# Patient Record
Sex: Female | Born: 2000 | Race: Black or African American | Hispanic: No | Marital: Single | Smoking: Never smoker
Health system: Southern US, Community
[De-identification: ages and names within clinical notes are randomized; demographics above are authoritative.]

## PROBLEM LIST (undated history)

## (undated) DIAGNOSIS — I10 Essential (primary) hypertension: Secondary | ICD-10-CM

## (undated) HISTORY — PX: WISDOM TOOTH EXTRACTION: SHX21

## (undated) HISTORY — PX: ENDOMETRIAL ABLATION: SHX621

---

## 2019-09-26 ENCOUNTER — Emergency Department (HOSPITAL_COMMUNITY)
Admission: EM | Admit: 2019-09-26 | Discharge: 2019-09-26 | Disposition: A | Discharge status: Home / Self Care | Payer: Self-pay

## 2019-09-26 MED ORDER — SODIUM CHLORIDE 0.9% FLUSH: 3.0000 mL | Freq: Once | INTRAVENOUS | Status: DC

## 2019-09-26 NOTE — ED Triage Notes (Signed)
Arrived POV patient in waiting room screaming and writhing in pain. Patient reports lower abdominal cramping and vomiting that started around 1400. Patient's skin hot and clammy

## 2019-09-26 NOTE — ED Notes (Signed)
Pt ambulating out of the room stating that she was going to a different hospital. This RN told pt that she was going to draw her blood. Pt refused and stated that we have done nothing for her and was leaving. Pt is ambulatory on discharge.

## 2019-10-22 ENCOUNTER — Emergency Department (HOSPITAL_COMMUNITY)
Admission: EM | Admit: 2019-10-22 | Discharge: 2019-10-22 | Disposition: A | Discharge status: Home / Self Care | Payer: Self-pay | Attending: Emergency Medicine | Admitting: Emergency Medicine

## 2019-10-22 ENCOUNTER — Other Ambulatory Visit: Payer: Self-pay

## 2019-10-22 ENCOUNTER — Encounter (HOSPITAL_COMMUNITY): Payer: Self-pay

## 2019-10-22 ENCOUNTER — Emergency Department (HOSPITAL_COMMUNITY): Payer: Self-pay

## 2019-10-22 DIAGNOSIS — E860 Dehydration: Secondary | ICD-10-CM

## 2019-10-22 DIAGNOSIS — Z3491 Encounter for supervision of normal pregnancy, unspecified, first trimester: Secondary | ICD-10-CM

## 2019-10-22 DIAGNOSIS — R300 Dysuria: Secondary | ICD-10-CM | POA: Insufficient documentation

## 2019-10-22 DIAGNOSIS — O26891 Other specified pregnancy related conditions, first trimester: Secondary | ICD-10-CM | POA: Insufficient documentation

## 2019-10-22 DIAGNOSIS — K625 Hemorrhage of anus and rectum: Secondary | ICD-10-CM | POA: Insufficient documentation

## 2019-10-22 DIAGNOSIS — R102 Pelvic and perineal pain: Secondary | ICD-10-CM

## 2019-10-22 DIAGNOSIS — R55 Syncope and collapse: Secondary | ICD-10-CM

## 2019-10-22 DIAGNOSIS — R1084 Generalized abdominal pain: Secondary | ICD-10-CM | POA: Insufficient documentation

## 2019-10-22 DIAGNOSIS — Z3A01 Less than 8 weeks gestation of pregnancy: Secondary | ICD-10-CM | POA: Insufficient documentation

## 2019-10-22 DIAGNOSIS — N898 Other specified noninflammatory disorders of vagina: Secondary | ICD-10-CM | POA: Insufficient documentation

## 2019-10-22 LAB — URINALYSIS, ROUTINE W REFLEX MICROSCOPIC
Bacteria, UA: NONE SEEN
Bilirubin Urine: NEGATIVE
Glucose, UA: NEGATIVE mg/dL
Hgb urine dipstick: NEGATIVE
Ketones, ur: 80 mg/dL — AB
Leukocytes,Ua: NEGATIVE
Nitrite: NEGATIVE
Protein, ur: 30 mg/dL — AB
Specific Gravity, Urine: 1.031 — ABNORMAL HIGH (ref 1.005–1.030)
pH: 5 (ref 5.0–8.0)

## 2019-10-22 LAB — CBC
HCT: 41.5 % (ref 36.0–46.0)
Hemoglobin: 14.1 g/dL (ref 12.0–15.0)
MCH: 32.3 pg (ref 26.0–34.0)
MCHC: 34 g/dL (ref 30.0–36.0)
MCV: 95 fL (ref 80.0–100.0)
Platelets: 279 10*3/uL (ref 150–400)
RBC: 4.37 MIL/uL (ref 3.87–5.11)
RDW: 11.8 % (ref 11.5–15.5)
WBC: 9.4 10*3/uL (ref 4.0–10.5)
nRBC: 0 % (ref 0.0–0.2)

## 2019-10-22 LAB — BASIC METABOLIC PANEL
Anion gap: 10 (ref 5–15)
BUN: 13 mg/dL (ref 6–20)
CO2: 22 mmol/L (ref 22–32)
Calcium: 9 mg/dL (ref 8.9–10.3)
Chloride: 105 mmol/L (ref 98–111)
Creatinine, Ser: 0.77 mg/dL (ref 0.44–1.00)
GFR calc Af Amer: >60 mL/min (ref >60)
GFR calc non Af Amer: >60 mL/min (ref >60)
Glucose, Bld: 89 mg/dL (ref 70–99)
Potassium: 3.4 mmol/L — ABNORMAL LOW (ref 3.5–5.1)
Sodium: 137 mmol/L (ref 135–145)

## 2019-10-22 LAB — WET PREP, GENITAL
Clue Cells Wet Prep HPF POC: NONE SEEN
Sperm: NONE SEEN
Trich, Wet Prep: NONE SEEN
Yeast Wet Prep HPF POC: NONE SEEN

## 2019-10-22 LAB — HCG, QUANTITATIVE, PREGNANCY: hCG, Beta Chain, Quant, S: 106 m[IU]/mL — ABNORMAL HIGH (ref <5)

## 2019-10-22 LAB — HEPATIC FUNCTION PANEL
ALT: 14 U/L (ref 0–44)
AST: 20 U/L (ref 15–41)
Albumin: 4.1 g/dL (ref 3.5–5.0)
Alkaline Phosphatase: 52 U/L (ref 38–126)
Bilirubin, Direct: 0.3 mg/dL — ABNORMAL HIGH (ref 0.0–0.2)
Indirect Bilirubin: 2.7 mg/dL — ABNORMAL HIGH (ref 0.3–0.9)
Total Bilirubin: 3 mg/dL — ABNORMAL HIGH (ref 0.3–1.2)
Total Protein: 7.4 g/dL (ref 6.5–8.1)

## 2019-10-22 LAB — I-STAT BETA HCG BLOOD, ED (MC, WL, AP ONLY): I-stat hCG, quantitative: 102.9 m[IU]/mL — ABNORMAL HIGH (ref <5)

## 2019-10-22 LAB — CK: Total CK: 151 U/L (ref 38–234)

## 2019-10-22 MED ORDER — LACTATED RINGERS IV BOLUS
1000.0000 mL | Freq: Once | INTRAVENOUS | Status: AC
  Administered 2019-10-22: 1000 mL via INTRAVENOUS

## 2019-10-22 NOTE — ED Notes (Signed)
Hooked patient up to the monitor patient is resting with call bell in reach 

## 2019-10-22 NOTE — Discharge Instructions (Signed)
So today your labs looked normal except for you were dehydrated.  Also it appears that you are early pregnant but the ultrasound was normal.  It is very important that you follow-up with OB/GYN to have those blood test rechecked to make sure it is increasing appropriately.  Also you can start taking an over the counter prenatal vitamin.  Make sure you are staying well-hydrated.

## 2019-10-22 NOTE — ED Notes (Signed)
Patient is geeting into a gown

## 2019-10-22 NOTE — ED Provider Notes (Signed)
[facility] EMERGENCY DEPARTMENT Provider Note   CSN: [id number] Arrival date & time: 10/22/19  1001     History Chief Complaint  Patient presents with  . Loss of Consciousness    Lori Pruitt is a 19 y.o. female.  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  Patient reported for the last 2 weeks she has had diffuse body pain even with minimal activity and is just not felt well.  She states she has a poor appetite and she is only been eating every other day but is trying to drink plenty of fluids and do protein shakes intermittently.  She normally has constipation and last bowel movement was several days ago.  She does have intermittent bright red blood with her stool and is also present on the toilet paper.  She has had some mild vaginal discharge and reports that she always has some burning with urination.  She denies any cough or shortness of breath.  Yesterday she was standing in the kitchen cooking when she started to feel lightheaded and woozy and went to lay down but passed out prior to getting to the bed.  She reports today she has just not felt well.  She denies any localized constant abdominal pain but states she gets intermittent cramps like when she is on her cycle.  She denies any localized back pain, fever or vomiting.   The history is provided by the patient.  Loss of Consciousness Episode history:  Single Most recent episode:  Yesterday Timing:  Constant Progression:  Resolved Chronicity:  New Context: standing up   Witnessed: no   Relieved by:  Lying down Worsened by:  Nothing Ineffective treatments:  None tried      History reviewed. No pertinent past medical history.  There are no problems to display for this  patient.   History reviewed. No pertinent surgical history.   OB History   No obstetric history on file.     No family history on file.  Social History   Tobacco Use  . Smoking status: Not on file  Substance Use Topics  . Alcohol use: Not on file  . Drug use: Not on file    Home Medications Prior to Admission medications   Medication Sig Start Date End Date Taking? Authorizing Provider  HYDROcodone-acetaminophen (NORCO/VICODIN) 5-325 MG tablet Take 1 tablet by mouth every 6 (six) hours as needed for moderate pain.   Yes [provider]  busPIRone (BUSPAR) 5 MG tablet Take 5 mg by mouth in the morning, at noon, and at bedtime.    [provider]  PRESCRIPTION MEDICATION Take 1 tablet by mouth daily. Diuretic Medication    [provider]    Allergies    Amoxicillin and Penicillins  Review of Systems   Review of Systems  Cardiovascular: Positive for syncope.  All other systems reviewed and are negative.   Physical Exam Updated Vital Signs BP 125/84 (BP Location: Right Arm)   Pulse (!) 115   Temp 99.2 F (37.3 C) (Oral)   Resp (!) 24   Ht 5\' 3"  (1.6 m)   Wt 81.6 kg   LMP 09/30/2019   SpO2 100%   BMI 31.89 kg/m   Physical Exam Vitals and nursing note reviewed.  Constitutional:  General: She is not in acute distress.    Appearance: Normal appearance. She is well-developed.  HENT:     Head: Normocephalic and atraumatic.     Mouth/Throat:     Mouth: Mucous membranes are dry.  Eyes:     Conjunctiva/sclera: Conjunctivae normal.     Pupils: Pupils are equal, round, and reactive to light.  Cardiovascular:     Rate and Rhythm: Regular rhythm. Tachycardia present.     Pulses: Normal pulses.     Heart sounds: No murmur.  Pulmonary:     Effort: Pulmonary effort is normal. No respiratory distress.     Breath sounds: Normal breath sounds. No wheezing or rales.  Abdominal:     General: There is no distension.     Palpations:  Abdomen is soft.     Tenderness: There is no abdominal tenderness. There is no right CVA tenderness, left CVA tenderness, guarding or rebound.  Genitourinary:    Vagina: Vaginal discharge present.     Cervix: Discharge present. No cervical bleeding.     Uterus: Normal.      Adnexa:        Right: Tenderness present.        Left: Tenderness present.      Comments: Mild diffuse adnexal tenderness.  Thick curd-like discharge present in the vaginal vault Musculoskeletal:        General: No tenderness. Normal range of motion.     Cervical back: Normal range of motion and neck supple.  Skin:    General: Skin is warm and dry.     Findings: No erythema or rash.  Neurological:     General: No focal deficit present.     Mental Status: She is alert and oriented to person, place, and time. Mental status is at baseline.  Psychiatric:        Mood and Affect: Mood normal.        Behavior: Behavior normal.        Thought Content: Thought content normal.     ED Results / Procedures / Treatments   Labs (all labs ordered are listed, but only abnormal results are displayed) Labs Reviewed  WET PREP, GENITAL - Abnormal; Notable for the following components:      Result Value   WBC, Wet Prep HPF POC MODERATE (*)    All other components within normal limits  BASIC METABOLIC PANEL - Abnormal; Notable for the following components:   Potassium 3.4 (*)    All other components within normal limits  URINALYSIS, ROUTINE W REFLEX MICROSCOPIC - Abnormal; Notable for the following components:   APPearance HAZY (*)    Specific Gravity, Urine 1.031 (*)    Ketones, ur 80 (*)    Protein, ur 30 (*)    All other components within normal limits  HEPATIC FUNCTION PANEL - Abnormal; Notable for the following components:   Total Bilirubin 3.0 (*)    Bilirubin, Direct 0.3 (*)    Indirect Bilirubin 2.7 (*)    All other components within normal limits  HCG, QUANTITATIVE, PREGNANCY - Abnormal; Notable for the  following components:   hCG, Beta Chain, Quant, S 106 (*)    All other components within normal limits  I-STAT BETA HCG BLOOD, ED ([facility], [facility], [facility] ONLY) - Abnormal; Notable for the following components:   I-stat hCG, quantitative 102.9 (*)    All other components within normal limits  CBC  CK  GC/CHLAMYDIA PROBE AMP ([place]) NOT AT [facility]  EKG EKG Interpretation  Date/Time:  Wednesday October 22 2019 10:07:36 EDT Ventricular Rate:  129 PR Interval:  122 QRS Duration: 80 QT Interval:  290 QTC Calculation: 424 R Axis:   77 Text Interpretation: Sinus tachycardia Nonspecific T wave abnormality No previous tracing Confirmed by Gwyneth Sprout ([id number]) on 10/22/2019 10:30:17 AM   Radiology US OB LESS THAN 14 WEEKS WITH OB TRANSVAGINAL  Result Date: 10/22/2019 CLINICAL DATA:  Pelvic pain, first trimester pregnancy, quantitative beta HCG = 103; unknown LMP EXAM: OBSTETRIC <14 WK [place] AND TRANSVAGINAL OB US TECHNIQUE: Both transabdominal and transvaginal ultrasound examinations were performed for complete evaluation of the gestation as well as the maternal uterus, adnexal regions, and pelvic cul-de-sac. Transvaginal technique was performed to assess early pregnancy. COMPARISON:  None FINDINGS: Intrauterine gestational sac: Absent Yolk sac:  N/A Embryo:  N/A Cardiac Activity: N/A Heart Rate: N/A  bpm MSD:   mm    w     d CRL:    mm    w    d                  [place] EDC: Subchorionic hemorrhage:  N/A Maternal uterus/adnexae: Uterus retroverted, normal in morphology. Endometrial complex 12 mm thick, unremarkable. No endometrial fluid or gestational sac. RIGHT ovary normal size and morphology 2.7 x 3.2 x 2.7 cm, containing a corpus luteum. LEFT ovary normal size and morphology, 2.2 x 3.4 x 2.1 cm. Trace free pelvic fluid. No adnexal masses. IMPRESSION: No intrauterine gestation identified. Findings are consistent with pregnancy of unknown location. Differential diagnosis includes early intrauterine pregnancy  too early to visualize, spontaneous abortion, and ectopic pregnancy. Serial quantitative beta HCG and or follow-up ultrasound recommended to definitively exclude ectopic pregnancy. Electronically Signed   By: Ulyses Southward M.D.   On: 10/22/2019 13:47    Procedures Procedures (including critical care time)  Medications Ordered in ED Medications  lactated ringers bolus 1,000 mL (has no administration in time range)    ED Course  I have reviewed the triage vital signs and the nursing notes.  Pertinent labs & imaging results that were available during my care of the patient were reviewed by me and considered in my medical decision making (see chart for details).    MDM Rules/Calculators/A&P                      Patient presenting with symptoms of syncope and diffuse body pain for the last 2 weeks.  She has had decreased oral intake.  Also having some vaginal discharge and dysuria.  Patient has low-grade temperature today of 99.2 and a heart rate of 124.  EKG shows sinus tachycardia with a nonspecific T wave change which may be her baseline.  Patient's medical care has been done in [place] prior to this and she was most recently evaluated less than a month ago due to several medical conditions and had follow-up for cardiologist but has not seen them or had an echo done.  Patient had a negative D-dimer, labs and troponins done 3 weeks ago.  That time her UDS was positive for fentanyl.  She has a history of PID and recurrent pelvic abscess as well as endometriosis.  Patient syncope may have been related to hypotension or dehydration as she has had decreased oral intake.  She is also complaining of diffuse body pain.  Patient's hCG today is elevated at 100 and concerned that early pregnancy or possibly ectopic.  Be the cause  of her symptoms.  Patient has recently had prolactin levels, ACTH, cortisol, TSH and various other endocrine labs done as she has a known pituitary microadenoma and all those  tests are within normal limits within the last month.  Patient's BMP without acute findings, CBC with normal white count and hemoglobin.  We will do LFTs, CK and also will perform a pelvic exam with wet prep, GC and chlamydia.  Patient given IV fluids.  Most likely will need an ultrasound to rule out TOA or ectopic.  12:00 PM Patient's urine shows ketones but no evidence of infection, liver function and CK are still pending.  Pelvic exam with thick discharge that could be yeast but also diffuse pelvic tenderness.  Possible that this is PID however patient reports she always has this type of pain due to her endometriosis.  We will do an ultrasound to rule out acute infection or ectopic pregnancy as patient's hCG is 100.  Most likely it is early pregnancy and she will need followed by OB/GYN.  Patient did arrive here approximately 3 weeks ago from [place].  She did fly and reports her longest flight was 6 hours.  She has no unilateral leg pain or swelling.  2:45 PM Patient's wet prep with moderate white cells but otherwise normal, quantitative hCG is confirmatory with a hCG of 106.  Ultrasound without acute findings or evidence of ovarian pathology at this time.  Suspect this is just early pregnancy however instructed patient to follow-up with OB/GYN for serial hCGs.  Patient's urine without infection but does show 80 ketones.  Suspect dehydration is most likely why she syncopized.  Her CK was normal and LFTs show chronically elevated bilirubin but no other acute findings.  Patient's tachycardia improved after fluids to the low 100s and upper 90s which based on prior records seems to be her baseline.  Patient was discharged home in good condition. MDM Number of Diagnoses or Management Options First trimester pregnancy: new, needed workup Syncope and collapse: new, needed workup   Amount and/or Complexity of Data Reviewed Clinical lab tests: ordered and reviewed Tests in the radiology section of CPT:  ordered and reviewed Tests in the medicine section of CPT: ordered and reviewed Discussion of test results with the performing providers: yes Decide to obtain previous medical records or to obtain history from someone other than the patient: yes Obtain history from someone other than the patient: no Review and summarize past medical records: yes Discuss the patient with other providers: no Independent visualization of images, tracings, or specimens: yes  Risk of Complications, Morbidity, and/or Mortality Presenting problems: moderate Diagnostic procedures: minimal Management options: minimal  Patient Progress Patient progress: improved   Final Clinical Impression(s) / ED Diagnoses Final diagnoses:  First trimester pregnancy  Syncope and collapse  Dehydration    Rx / DC Orders ED Discharge Orders    None       Blanchie Dessert, MD 10/22/19 1447

## 2019-10-22 NOTE — ED Triage Notes (Signed)
Pt reports syncopal episode last night while cooking, pt c.o muscle aches today. Pt a.o, nad noted.

## 2019-10-23 LAB — GC/CHLAMYDIA PROBE AMP (~~LOC~~) NOT AT ARMC
Chlamydia: NEGATIVE
Comment: NEGATIVE
Comment: NORMAL
Neisseria Gonorrhea: NEGATIVE

## 2019-10-25 ENCOUNTER — Encounter (HOSPITAL_COMMUNITY): Payer: Self-pay

## 2019-10-25 ENCOUNTER — Inpatient Hospital Stay (HOSPITAL_COMMUNITY)
Admission: AD | Admit: 2019-10-25 | Discharge: 2019-10-25 | Disposition: A | Discharge status: Home / Self Care | Payer: Self-pay | Attending: Obstetrics & Gynecology | Admitting: Obstetrics & Gynecology

## 2019-10-25 ENCOUNTER — Other Ambulatory Visit: Payer: Self-pay

## 2019-10-25 DIAGNOSIS — O3680X0 Pregnancy with inconclusive fetal viability, not applicable or unspecified: Secondary | ICD-10-CM

## 2019-10-25 DIAGNOSIS — R109 Unspecified abdominal pain: Secondary | ICD-10-CM | POA: Insufficient documentation

## 2019-10-25 DIAGNOSIS — O26891 Other specified pregnancy related conditions, first trimester: Secondary | ICD-10-CM | POA: Insufficient documentation

## 2019-10-25 DIAGNOSIS — Z3A01 Less than 8 weeks gestation of pregnancy: Secondary | ICD-10-CM | POA: Insufficient documentation

## 2019-10-25 LAB — ABO/RH: ABO/RH(D): B POS

## 2019-10-25 LAB — CBC
HCT: 39.3 % (ref 36.0–46.0)
Hemoglobin: 13.6 g/dL (ref 12.0–15.0)
MCH: 32.7 pg (ref 26.0–34.0)
MCHC: 34.6 g/dL (ref 30.0–36.0)
MCV: 94.5 fL (ref 80.0–100.0)
Platelets: 292 10*3/uL (ref 150–400)
RBC: 4.16 MIL/uL (ref 3.87–5.11)
RDW: 11.9 % (ref 11.5–15.5)
WBC: 10.7 10*3/uL — ABNORMAL HIGH (ref 4.0–10.5)
nRBC: 0 % (ref 0.0–0.2)

## 2019-10-25 LAB — HCG, QUANTITATIVE, PREGNANCY: hCG, Beta Chain, Quant, S: 435 m[IU]/mL — ABNORMAL HIGH (ref <5)

## 2019-10-25 NOTE — ED Triage Notes (Signed)
Pt is complaining of what feels like period cramps for the last 4 days. Pt states she is pregnant. Pt found out she was pregnant on the 14th of this month. Pt denies any bleeding.

## 2019-10-25 NOTE — MAU Provider Note (Signed)
History     CSN: [id number]  Arrival date and time: 10/25/19 2043   First Provider Initiated Contact with Patient 10/25/19 2213      Chief Complaint  Patient presents with  . Abdominal Pain   Lori Pruitt is a 19 y.o. G4P0030 at [redacted]w[redacted]d by Definite LMP who has not established PNV.  She presents today for Abdominal Pain.  She states the pain started about 4 days ago and she describes as cramping.  She reports the pain is intermittent and "comes every couple of hours or when I am standing up."  She reports the pain is predominately on her right side with some transference to the left occasionally.  She reports she has not taken anything for the pain and denies any aggravating or relieving factors.  She reports some dizziness that makes it "feel like the room is spinning."  She reports that she last ate "the day before yesterday" and goes on to report that she doesn't have an appetite.  She reports drinking protein shakes twice daily.       OB History    Gravida  4   Para      Term      Preterm      AB  3   Living        SAB  2   TAB  1   Ectopic      Multiple      Live Births              History reviewed. No pertinent past medical history.  Past Surgical History:  Procedure Laterality Date  . ENDOMETRIAL ABLATION    . WISDOM TOOTH EXTRACTION      Family History  Problem Relation Age of Onset  . Diabetes Mother   . Hypertension Mother   . Hypertension Father   . Cancer Maternal Grandmother     Social History   Tobacco Use  . Smoking status: Never Smoker  Substance Use Topics  . Alcohol use: Never  . Drug use: Never    Allergies:  Allergies  Allergen Reactions  . Amoxicillin Anaphylaxis  . Penicillins Rash    Medications Prior to Admission  Medication Sig Dispense Refill Last Dose  . busPIRone (BUSPAR) 5 MG tablet Take 5 mg by mouth in the morning, at noon, and at bedtime.     Marland Kitchen HYDROcodone-acetaminophen (NORCO/VICODIN) 5-325 MG tablet  Take 1 tablet by mouth every 6 (six) hours as needed for moderate pain.     Marland Kitchen PRESCRIPTION MEDICATION Take 1 tablet by mouth daily. Diuretic Medication       Review of Systems  Constitutional: Negative for chills and fever.  Respiratory: Negative for cough and shortness of breath.   Gastrointestinal: Positive for abdominal pain and nausea. Negative for constipation, diarrhea and vomiting.  Genitourinary: Positive for dysuria ("All the time for years, even when I drink water." ). Negative for difficulty urinating, vaginal bleeding and vaginal discharge.  Neurological: Positive for dizziness. Negative for light-headedness and headaches.   Physical Exam   Blood pressure 122/78, pulse (!) 110, temperature 98.4 F (36.9 C), temperature source Oral, resp. rate 18, height 5\' 4"  (1.626 m), weight 81.6 kg, last menstrual period 09/30/2019, SpO2 100 %.  Physical Exam  Constitutional: She is oriented to person, place, and time. She appears well-developed and well-nourished. No distress.  HENT:  Head: Normocephalic and atraumatic.  Eyes: Conjunctivae are normal.  Cardiovascular: Normal rate, regular rhythm and normal heart sounds.  Respiratory:  Effort normal and breath sounds normal. No respiratory distress.  GI: Soft. She exhibits no distension. There is no abdominal tenderness. There is no rebound and no guarding.  Pt denies pain or tenderness, but reports "pressure' with palpation particular to RLQ and SP areas.   Musculoskeletal:        General: No tenderness or edema. Normal range of motion.     Cervical back: Normal range of motion.  Neurological: She is alert and oriented to person, place, and time.  Skin: Skin is warm and dry.  Psychiatric: She has a normal mood and affect. Her behavior is normal.    MAU Course  Procedures Results for orders placed or performed during the hospital encounter of 10/25/19 (from the past 24 hour(s))  CBC     Status: Abnormal   Collection Time: 10/25/19   9:55 PM  Result Value Ref Range   WBC 10.7 (H) 4.0 - 10.5 K/uL   RBC 4.16 3.87 - 5.11 MIL/uL   Hemoglobin 13.6 12.0 - 15.0 g/dL   HCT 27.0 35.0 - 09.3 %   MCV 94.5 80.0 - 100.0 fL   MCH 32.7 26.0 - 34.0 pg   MCHC 34.6 30.0 - 36.0 g/dL   RDW 81.8 29.9 - 37.1 %   Platelets 292 150 - 400 K/uL   nRBC 0.0 0.0 - 0.2 %  hCG, quantitative, pregnancy     Status: Abnormal   Collection Time: 10/25/19  9:55 PM  Result Value Ref Range   hCG, Beta Chain, Quant, S 435 (H) <5 mIU/mL  ABO/Rh     Status: None   Collection Time: 10/25/19  9:55 PM  Result Value Ref Range   ABO/RH(D) B POS    No rh immune globuloin      NOT A RH IMMUNE GLOBULIN CANDIDATE, PT RH POSITIVE Performed at [facility], [address], [place], [place] [address]     MDM Physical Exam Labs: ABO, CBC, hCG  Assessment and Plan  19 year old  G4P0030 at 3.4 weeks Abdominal Pain  -POC reviewed. -Informed that pelvic exam with testing performed with last c/o abdominal pain on 4/14 with all negative results. Will be deferred today. -Discussed need for repeat quant to r/o ectopic pregnancy considering pain located predominately on right side. -Offered pain medication and patient declines.  -Will await for results  Cherre Robins 10/25/2019, 10:13 PM   Reassessment (11:14 PM)  -Lori Pruitt returns with appropriate rise considering last quant was 3 days ago. -In room to discuss with patient who is going through cabinets and removing supplies! -Results discussed with patient as she places saline drops in her nares.  -Provider expresses concern with abdominal pain and patient agreeable to additional quant in 48 hours to confirm continued rise. -Scheduled for [place] office on Tuesday April 20th at 0900. -Instructed to monitor abdominal discomfort and return if symptoms worsen.  -Further instructed to take tylenol for management of pain. -Patient without further questions or concerns. -Information given on follow up and  testing. -Encouraged to call or return to MAU if symptoms worsen or with the onset of new symptoms. -Discharged to home in stable condition.  Cherre Robins MSN, CNM Advanced Practice Provider, [facility] [employer]

## 2019-10-25 NOTE — MAU Note (Signed)
Pt reports to MAU c/o cramping x 4 days. No bleeding. Pt states she was at work and she kept cramping so she came to the hospital. 4/10 on the pain scale. Pt has not taken any medications.

## 2019-10-25 NOTE — Discharge Instructions (Signed)
Human Chorionic Gonadotropin Test Why am I having this test? A human chorionic gonadotropin (hCG) test is done to determine whether you are pregnant. It can also be used:  To diagnose an abnormal pregnancy.  To determine whether you have had a failed pregnancy (miscarriage) or are at risk of one. What is being tested? This test checks the level of the human chorionic gonadotropin (hCG) hormone in the blood. This hormone is produced during pregnancy by the cells that form the placenta. The placenta is the organ that grows inside your womb (uterus) to nourish a developing baby. When you are pregnant, hCG can be detected in your blood or urine 7 to 8 days before your missed period. It continues to go up for the first 8-10 weeks of pregnancy. The presence of hCG in your blood can be measured with several different types of tests. You may have:  A urine test. ? Because this hormone is eliminated from your body by your kidneys, you may have a urine test to find out whether you are pregnant. A home pregnancy test detects whether there is hCG in your urine. ? A urine test only shows whether there is hCG in your urine. It does not measure how much.  A qualitative blood test. ? You may have this type of blood test to find out if you are pregnant. ? This blood test only shows whether there is hCG in your blood. It does not measure how much.  A quantitative blood test. ? This type of blood test measures the amount of hCG in your blood. ? You may have this test to:  Diagnose an abnormal pregnancy.  Check whether you have had a miscarriage.  Determine whether you are at risk of a miscarriage. What kind of sample is taken?     Two kinds of samples may be collected to test for the hCG hormone.  Blood. It is usually collected by inserting a needle into a blood vessel.  Urine. It is usually collected by urinating into a germ-free (sterile) specimen cup. It is best to collect the sample the first  time you urinate in the morning. How do I prepare for this test? No preparation is needed for a blood test.  For the urine test:  Let your health care provider know about: ? All medicines you are taking, including vitamins, herbs, creams, and over-the-counter medicines. ? Any blood in your urine. This may interfere with the result.  Do not drink too much fluid. Drink as you normally would, or as directed by your health care provider. How are the results reported? Depending on the type of test that you have, your test results may be reported as values. Your health care provider will compare your results to normal ranges that were established after testing a large group of people (reference ranges). Reference ranges may vary among labs and hospitals. For this test, common reference ranges that show absence of pregnancy are:  Quantitative hCG blood levels: less than 5 IU/L. Other results will be reported as either positive or negative. For this test, normal results (meaning the absence of pregnancy) are:  Negative for hCG in the urine test.  Negative for hCG in the qualitative blood test. What do the results mean? Urine and qualitative blood test  A negative result could mean: ? That you are not pregnant. ? That the test was done too early in your pregnancy to detect hCG in your blood or urine. If you still have other signs   of pregnancy, the test will be repeated.  A positive result means: ? That you are most likely pregnant. Your health care provider may confirm your pregnancy with an imaging study (ultrasound) of your uterus, if needed. Quantitative blood test Results of the quantitative hCG blood test will be interpreted as follows:  Less than 5 IU/L: You are most likely not pregnant.  Greater than 25 IU/L: You are most likely pregnant.  hCG levels that are higher than expected: ? You are pregnant with twins. ? You have abnormal growths in the uterus.  hCG levels that are  rising more slowly than expected: ? You have an ectopic pregnancy (also called a tubal pregnancy).  hCG levels that are falling: ? You may be having a miscarriage. Talk with your health care provider about what your results mean. Questions to ask your health care provider Ask your health care provider, or the department that is doing the test:  When will my results be ready?  How will I get my results?  What are my treatment options?  What other tests do I need?  What are my next steps? Summary  A human chorionic gonadotropin test is done to determine whether you are pregnant.  When you are pregnant, hCG can be detected in your blood or urine 7 to 8 days before your missed period. It continues to go up for the first 8-10 weeks of pregnancy.  Your hCG level can be measured with different types of tests. You may have a urine test, a qualitative blood test, or a quantitative blood test.  Talk with your health care provider about what your results mean. This information is not intended to replace advice given to you by your health care provider. Make sure you discuss any questions you have with your health care provider. Document Revised: 05/28/2017 Document Reviewed: 05/28/2017 Elsevier Patient Education  2020 [employer]vier Inc.  

## 2019-10-27 ENCOUNTER — Other Ambulatory Visit: Payer: Self-pay

## 2019-10-28 ENCOUNTER — Ambulatory Visit: Payer: Self-pay

## 2019-10-30 ENCOUNTER — Inpatient Hospital Stay (HOSPITAL_COMMUNITY): Payer: Self-pay

## 2019-10-30 ENCOUNTER — Other Ambulatory Visit: Payer: Self-pay

## 2019-10-30 ENCOUNTER — Inpatient Hospital Stay (HOSPITAL_COMMUNITY)
Admission: EM | Admit: 2019-10-30 | Discharge: 2019-10-30 | Disposition: A | Discharge status: Home / Self Care | Payer: Self-pay | Attending: Family Medicine | Admitting: Family Medicine

## 2019-10-30 ENCOUNTER — Encounter (HOSPITAL_COMMUNITY): Payer: Self-pay | Admitting: Emergency Medicine

## 2019-10-30 DIAGNOSIS — O210 Mild hyperemesis gravidarum: Secondary | ICD-10-CM | POA: Insufficient documentation

## 2019-10-30 DIAGNOSIS — Z3490 Encounter for supervision of normal pregnancy, unspecified, unspecified trimester: Secondary | ICD-10-CM

## 2019-10-30 DIAGNOSIS — Z3A01 Less than 8 weeks gestation of pregnancy: Secondary | ICD-10-CM

## 2019-10-30 DIAGNOSIS — Z880 Allergy status to penicillin: Secondary | ICD-10-CM | POA: Insufficient documentation

## 2019-10-30 DIAGNOSIS — O219 Vomiting of pregnancy, unspecified: Secondary | ICD-10-CM

## 2019-10-30 DIAGNOSIS — O3680X0 Pregnancy with inconclusive fetal viability, not applicable or unspecified: Secondary | ICD-10-CM

## 2019-10-30 HISTORY — DX: Essential (primary) hypertension: I10

## 2019-10-30 LAB — COMPREHENSIVE METABOLIC PANEL
ALT: 11 U/L (ref 0–44)
AST: 21 U/L (ref 15–41)
Albumin: 3.7 g/dL (ref 3.5–5.0)
Alkaline Phosphatase: 68 U/L (ref 38–126)
Anion gap: 9 (ref 5–15)
BUN: 8 mg/dL (ref 6–20)
CO2: 19 mmol/L — ABNORMAL LOW (ref 22–32)
Calcium: 8.7 mg/dL — ABNORMAL LOW (ref 8.9–10.3)
Chloride: 108 mmol/L (ref 98–111)
Creatinine, Ser: 0.66 mg/dL (ref 0.44–1.00)
GFR calc Af Amer: >60 mL/min (ref >60)
GFR calc non Af Amer: >60 mL/min (ref >60)
Glucose, Bld: 87 mg/dL (ref 70–99)
Potassium: 4 mmol/L (ref 3.5–5.1)
Sodium: 136 mmol/L (ref 135–145)
Total Bilirubin: 0.8 mg/dL (ref 0.3–1.2)
Total Protein: 6.4 g/dL — ABNORMAL LOW (ref 6.5–8.1)

## 2019-10-30 LAB — URINALYSIS, ROUTINE W REFLEX MICROSCOPIC
Bilirubin Urine: NEGATIVE
Glucose, UA: NEGATIVE mg/dL
Hgb urine dipstick: NEGATIVE
Ketones, ur: NEGATIVE mg/dL
Leukocytes,Ua: NEGATIVE
Nitrite: NEGATIVE
Protein, ur: NEGATIVE mg/dL
Specific Gravity, Urine: 1.021 (ref 1.005–1.030)
pH: 6 (ref 5.0–8.0)

## 2019-10-30 LAB — CBC
HCT: 41 % (ref 36.0–46.0)
Hemoglobin: 14 g/dL (ref 12.0–15.0)
MCH: 32.3 pg (ref 26.0–34.0)
MCHC: 34.1 g/dL (ref 30.0–36.0)
MCV: 94.7 fL (ref 80.0–100.0)
Platelets: 334 10*3/uL (ref 150–400)
RBC: 4.33 MIL/uL (ref 3.87–5.11)
RDW: 11.9 % (ref 11.5–15.5)
WBC: 7.8 10*3/uL (ref 4.0–10.5)
nRBC: 0 % (ref 0.0–0.2)

## 2019-10-30 LAB — I-STAT BETA HCG BLOOD, ED (MC, WL, AP ONLY): I-stat hCG, quantitative: >2000 m[IU]/mL — ABNORMAL HIGH (ref <5)

## 2019-10-30 LAB — LIPASE, BLOOD: Lipase: 30 U/L (ref 11–51)

## 2019-10-30 LAB — HCG, QUANTITATIVE, PREGNANCY: hCG, Beta Chain, Quant, S: 3486 m[IU]/mL — ABNORMAL HIGH (ref <5)

## 2019-10-30 MED ORDER — SODIUM CHLORIDE 0.9% FLUSH: 3.0000 mL | Freq: Once | INTRAVENOUS | Status: DC

## 2019-10-30 MED ORDER — METOCLOPRAMIDE HCL 10 MG PO TABS: 10.0000 mg | ORAL_TABLET | Freq: Three times a day (TID) | ORAL | 1 refills | Status: AC

## 2019-10-30 MED ORDER — FAMOTIDINE 20 MG PO TABS: 20.0000 mg | ORAL_TABLET | Freq: Every day | ORAL | 0 refills | Status: AC

## 2019-10-30 MED ORDER — ONDANSETRON 4 MG PO TBDP
4.0000 mg | ORAL_TABLET | Freq: Once | ORAL | Status: AC | PRN
  Administered 2019-10-30: 4 mg via ORAL
  Filled 2019-10-30: qty 1

## 2019-10-30 NOTE — Discharge Instructions (Signed)
Safe Medications in Pregnancy    Acne: Benzoyl Peroxide Salicylic Acid  Backache/Headache: Tylenol: 2 regular strength every 4 hours OR              2 Extra strength every 6 hours  Colds/Coughs/Allergies: Benadryl (alcohol free) 25 mg every 6 hours as needed Breath right strips Claritin Cepacol throat lozenges Chloraseptic throat spray Cold-Eeze- up to three times per day Cough drops, alcohol free Flonase (by prescription only) Guaifenesin Mucinex Robitussin DM (plain only, alcohol free) Saline nasal spray/drops Sudafed (pseudoephedrine) & Actifed ** use only after [redacted] weeks gestation and if you do not have high blood pressure Tylenol Vicks Vaporub Zinc lozenges Zyrtec   Constipation: Colace Ducolax suppositories Fleet enema Glycerin suppositories Metamucil Milk of magnesia Miralax Senokot Smooth move tea  Diarrhea: Kaopectate Imodium A-D  *NO pepto Bismol  Hemorrhoids: Anusol Anusol HC Preparation H Tucks  Indigestion: Tums Maalox Mylanta Zantac  Pepcid  Insomnia: Benadryl (alcohol free) 25mg  every 6 hours as needed Tylenol PM Unisom, no Gelcaps  Leg Cramps: Tums MagGel  Nausea/Vomiting:  Bonine Dramamine Emetrol Ginger extract Sea bands Meclizine  Nausea medication to take during pregnancy:  Unisom (doxylamine succinate 25 mg tablets) Take one tablet daily at bedtime. If symptoms are not adequately controlled, the dose can be increased to a maximum recommended dose of two tablets daily (1/2 tablet in the morning, 1/2 tablet mid-afternoon and one at bedtime). Vitamin B6 100mg  tablets. Take one tablet twice a day (up to 200 mg per day).  Skin Rashes: Aveeno products Benadryl cream or 25mg  every 6 hours as needed Calamine Lotion 1% cortisone cream  Yeast infection: Gyne-lotrimin 7 Monistat 7   **If taking multiple medications, please check labels to avoid duplicating the same active ingredients **take  medication as directed on the label ** Do not exceed 4000 mg of tylenol in 24 hours **Do not take medications that contain aspirin or ibuprofen         Abdominal Pain During Pregnancy  Abdominal pain is common during pregnancy, and has many possible causes. Some causes are more serious than others, and sometimes the cause is not known. Abdominal pain can be a sign that labor is starting. It can also be caused by normal growth and stretching of muscles and ligaments during pregnancy. Always tell your health care provider if you have any abdominal pain. Follow these instructions at home:  Do not have sex or put anything in your vagina until your pain goes away completely.  Get plenty of rest until your pain improves.  Drink enough fluid to keep your urine pale yellow.  Take over-the-counter and prescription medicines only as told by your health care provider.  Keep all follow-up visits as told by your health care provider. This is important. Contact a health care provider if:  Your pain continues or gets worse after resting.  You have lower abdominal pain that: ? Comes and goes at regular intervals. ? Spreads to your back. ? Is similar to menstrual cramps.  You have pain or burning when you urinate. Get help right away if:  You have a fever or chills.  You have vaginal bleeding.  You are leaking fluid from your vagina.  You are passing tissue from your vagina.  You have vomiting or diarrhea that lasts for more than 24 hours.  Your baby is moving less than usual.  You feel very weak or faint.  You have shortness of breath.  You develop severe pain in your upper abdomen.  Summary  Abdominal pain is common during pregnancy, and has many possible causes.  If you experience abdominal pain during pregnancy, tell your health care provider right away.  Follow your health care provider's home care instructions and keep all follow-up visits as directed. This  information is not intended to replace advice given to you by your health care provider. Make sure you discuss any questions you have with your health care provider. Document Revised: 10/14/2018 Document Reviewed: 09/28/2016 Elsevier Patient Education  2020 [employer].      First Trimester of Pregnancy The first trimester of pregnancy is from week 1 until the end of week 13 (months 1 through 3). A week after a sperm fertilizes an egg, the egg will implant on the wall of the uterus. This embryo will begin to develop into a baby. Genes from you and your partner will form the baby. The female genes will determine whether the baby will be a boy or a girl. At 6-8 weeks, the eyes and face will be formed, and the heartbeat can be seen on ultrasound. At the end of 12 weeks, all the baby's organs will be formed. Now that you are pregnant, you will want to do everything you can to have a healthy baby. Two of the most important things are to get good prenatal care and to follow your health care provider's instructions. Prenatal care is all the medical care you receive before the baby's birth. This care will help prevent, find, and treat any problems during the pregnancy and childbirth. Body changes during your first trimester Your body goes through many changes during pregnancy. The changes vary from woman to woman.  You may gain or lose a couple of pounds at first.  You may feel sick to your stomach (nauseous) and you may throw up (vomit). If the vomiting is uncontrollable, call your health care provider.  You may tire easily.  You may develop headaches that can be relieved by medicines. All medicines should be approved by your health care provider.  You may urinate more often. Painful urination may mean you have a bladder infection.  You may develop heartburn as a result of your pregnancy.  You may develop constipation because certain hormones are causing the muscles that push stool through your  intestines to slow down.  You may develop hemorrhoids or swollen veins (varicose veins).  Your breasts may begin to grow larger and become tender. Your nipples may stick out more, and the tissue that surrounds them (areola) may become darker.  Your gums may bleed and may be sensitive to brushing and flossing.  Dark spots or blotches (chloasma, mask of pregnancy) may develop on your face. This will likely fade after the baby is born.  Your menstrual periods will stop.  You may have a loss of appetite.  You may develop cravings for certain kinds of food.  You may have changes in your emotions from day to day, such as being excited to be pregnant or being concerned that something may go wrong with the pregnancy and baby.  You may have more vivid and strange dreams.  You may have changes in your hair. These can include thickening of your hair, rapid growth, and changes in texture. Some women also have hair loss during or after pregnancy, or hair that feels dry or thin. Your hair will most likely return to normal after your baby is born. What to expect at prenatal visits During a routine prenatal visit:  You will be  weighed to make sure you and the baby are growing normally.  Your blood pressure will be taken.  Your abdomen will be measured to track your baby's growth.  The fetal heartbeat will be listened to between weeks 10 and 14 of your pregnancy.  Test results from any previous visits will be discussed. Your health care provider may ask you:  How you are feeling.  If you are feeling the baby move.  If you have had any abnormal symptoms, such as leaking fluid, bleeding, severe headaches, or abdominal cramping.  If you are using any tobacco products, including cigarettes, chewing tobacco, and electronic cigarettes.  If you have any questions. Other tests that may be performed during your first trimester include:  Blood tests to find your blood type and to check for the  presence of any previous infections. The tests will also be used to check for low iron levels (anemia) and protein on red blood cells (Rh antibodies). Depending on your risk factors, or if you previously had diabetes during pregnancy, you may have tests to check for high blood sugar that affects pregnant women (gestational diabetes).  Urine tests to check for infections, diabetes, or protein in the urine.  An ultrasound to confirm the proper growth and development of the baby.  Fetal screens for spinal cord problems (spina bifida) and Down syndrome.  HIV (human immunodeficiency virus) testing. Routine prenatal testing includes screening for HIV, unless you choose not to have this test.  You may need other tests to make sure you and the baby are doing well. Follow these instructions at home: Medicines  Follow your health care provider's instructions regarding medicine use. Specific medicines may be either safe or unsafe to take during pregnancy.  Take a prenatal vitamin that contains at least 600 micrograms (mcg) of folic acid.  If you develop constipation, try taking a stool softener if your health care provider approves. Eating and drinking   Eat a balanced diet that includes fresh fruits and vegetables, whole grains, good sources of protein such as meat, eggs, or tofu, and low-fat dairy. Your health care provider will help you determine the amount of weight gain that is right for you.  Avoid raw meat and uncooked cheese. These carry germs that can cause birth defects in the baby.  Eating four or five small meals rather than three large meals a day may help relieve nausea and vomiting. If you start to feel nauseous, eating a few soda crackers can be helpful. Drinking liquids between meals, instead of during meals, also seems to help ease nausea and vomiting.  Limit foods that are high in fat and processed sugars, such as fried and sweet foods.  To prevent constipation: ? Eat foods  that are high in fiber, such as fresh fruits and vegetables, whole grains, and beans. ? Drink enough fluid to keep your urine clear or pale yellow. Activity  Exercise only as directed by your health care provider. Most women can continue their usual exercise routine during pregnancy. Try to exercise for 30 minutes at least 5 days a week. Exercising will help you: ? Control your weight. ? Stay in shape. ? Be prepared for labor and delivery.  Experiencing pain or cramping in the lower abdomen or lower back is a good sign that you should stop exercising. Check with your health care provider before continuing with normal exercises.  Try to avoid standing for long periods of time. Move your legs often if you must stand in one  place for a long time.  Avoid heavy lifting.  Wear low-heeled shoes and practice good posture.  You may continue to have sex unless your health care provider tells you not to. Relieving pain and discomfort  Wear a good support bra to relieve breast tenderness.  Take warm sitz baths to soothe any pain or discomfort caused by hemorrhoids. Use hemorrhoid cream if your health care provider approves.  Rest with your legs elevated if you have leg cramps or low back pain.  If you develop varicose veins in your legs, wear support hose. Elevate your feet for 15 minutes, 3-4 times a day. Limit salt in your diet. Prenatal care  Schedule your prenatal visits by the twelfth week of pregnancy. They are usually scheduled monthly at first, then more often in the last 2 months before delivery.  Write down your questions. Take them to your prenatal visits.  Keep all your prenatal visits as told by your health care provider. This is important. Safety  Wear your seat belt at all times when driving.  Make a list of emergency phone numbers, including numbers for family, friends, the hospital, and police and fire departments. General instructions  Ask your health care provider for  a referral to a local prenatal education class. Begin classes no later than the beginning of month 6 of your pregnancy.  Ask for help if you have counseling or nutritional needs during pregnancy. Your health care provider can offer advice or refer you to specialists for help with various needs.  Do not use hot tubs, steam rooms, or saunas.  Do not douche or use tampons or scented sanitary pads.  Do not cross your legs for long periods of time.  Avoid cat litter boxes and soil used by cats. These carry germs that can cause birth defects in the baby and possibly loss of the fetus by miscarriage or stillbirth.  Avoid all smoking, herbs, alcohol, and medicines not prescribed by your health care provider. Chemicals in these products affect the formation and growth of the baby.  Do not use any products that contain nicotine or tobacco, such as cigarettes and e-cigarettes. If you need help quitting, ask your health care provider. You may receive counseling support and other resources to help you quit.  Schedule a dentist appointment. At home, brush your teeth with a soft toothbrush and be gentle when you floss. Contact a health care provider if:  You have dizziness.  You have mild pelvic cramps, pelvic pressure, or nagging pain in the abdominal area.  You have persistent nausea, vomiting, or diarrhea.  You have a bad smelling vaginal discharge.  You have pain when you urinate.  You notice increased swelling in your face, hands, legs, or ankles.  You are exposed to fifth disease or chickenpox.  You are exposed to [place] measles (rubella) and have never had it. Get help right away if:  You have a fever.  You are leaking fluid from your vagina.  You have spotting or bleeding from your vagina.  You have severe abdominal cramping or pain.  You have rapid weight gain or loss.  You vomit blood or material that looks like coffee grounds.  You develop a severe headache.  You have  shortness of breath.  You have any kind of trauma, such as from a fall or a car accident. Summary  The first trimester of pregnancy is from week 1 until the end of week 13 (months 1 through 3).  Your body goes through  many changes during pregnancy. The changes vary from woman to woman.  You will have routine prenatal visits. During those visits, your health care provider will examine you, discuss any test results you may have, and talk with you about how you are feeling. This information is not intended to replace advice given to you by your health care provider. Make sure you discuss any questions you have with your health care provider. Document Revised: 06/08/2017 Document Reviewed: 06/07/2016 Elsevier Patient Education  2020 [facility]     Phone: [phone]  [facility] [place] [facility] [place]  Phone: [phone]  [facility] [employer] [facility] [place]  Phone: [place] [facility] [employer] [facility] [place]                           Phone: [place] [facility] [place] [facility]          Phone: [phone]  [facility]    Phone: [phone]   [facility])    Phone: [facility]    Phone: [phone]  [facility]    Phone: [place]    Phone: [phone]  [facility]  Phone: [place]               Phone: [phone]  [facility]   Phone: [phone]  [facility]    Phone: [phone]

## 2019-10-30 NOTE — MAU Note (Signed)
.   Lori Pruitt is a 19 y.o. at [redacted]w[redacted]d here in MAU reporting: she was sent over from the ED to be evaluated due to N/V and lower abdominal cramping. Denies any VB LMP: 09/30/19 Onset of complaint: couple of weeks Pain score: 5 Vitals:   10/30/19 1248 10/30/19 1445  BP: 127/84 117/76  Pulse: (!) 102 95  Resp: 18 16  Temp: 99.3 F (37.4 C) 98.3 F (36.8 C)  SpO2: 100% 100%     FHT: Lab orders placed from triage:

## 2019-10-30 NOTE — ED Triage Notes (Addendum)
Pt confirmed pregnancy on 10/19/19, EDD 07/06/20. LMP 09/30/19. Pt reports intermittent epigastric abd pain and cramping since confirmed pregnancy with n/v. Pt reports she has passed out twice at work d/t this pain/cramps. Pt denies any abnormal vaginal bleeding.   Pt also reports ankle swelling and is wanting a prescription to refill her Chlorthalidone.

## 2019-10-30 NOTE — ED Provider Notes (Signed)
MSE was initiated and I personally evaluated the patient and placed orders (if any) at  1:43 PM on October 30, 2019.  Patient presented for evaluation of abdominal cramping, nausea, vomiting.  Cramping has been going on for many months, however nausea started few days ago and she threw up 4-5 times today.  She denies blood in her emesis.  She states she is pregnant, does not know how far along.  She has had an ultrasound which did not show an IUP, she has not had a confirmed IUP.  She had an appointment with OB/GYN outpatient yesterday, but missed her appointment.  She denies vaginal bleeding or discharge. Additionally, patient states he has been out of her chlorthalidone for several weeks, as such she is having worsened bilateral lower leg swelling.  On exam, patient peers nontoxic.  Mild lower abdominal tenderness.  No significant pitting edema.  Blood pressure stable.  Discussed with Erin from MAU, who accepts patient for transfer.  The patient appears stable so that the remainder of the MSE may be completed by another provider.   Alveria Apley, PA-C 10/30/19 1344    Tegeler, Canary Brim, MD 10/30/19 [phone]

## 2019-10-30 NOTE — MAU Provider Note (Signed)
History     CSN: [id number]  Arrival date and time: 10/30/19 1236   First Provider Initiated Contact with Patient 10/30/19 1521      Chief Complaint  Patient presents with  . Abdominal Pain  . Morning Sickness   [name] is a 19 y.o. G4P0030 at 5635w2d who presents to MAU for pelvic cramping, nausea and vomiting. Patient was seen in the ED 10/22/2019 and diagnosed with a PUL. Patient came to MAU on 10/25/2019 and had appropriate rise in hCG and was scheduled for repeat quant 10/28/2019 at [facility], but patient did not show for appointment because she forgot.  Onset: few weeks ago Location: mid-line pelvis Duration: few weeks Character: patient reports pelvic pain is different every time it occurs, intermittent, pt describes pain as menstrual-like cramping Aggravating/Associated: none/N/V - pt reports nausea prior to pregnancy, but has worsened with pregnancy, vomiting since this morning x4 Relieving: none Treatment: Tylenol - pt reports it does not help Severity: 0/10 (pt reports pain not present at this time)  Pt denies VB, vaginal discharge/odor/itching. Pt denies N/V, abdominal pain, constipation, diarrhea, or urinary problems. Pt denies fever, chills, fatigue, sweating or changes in appetite. Pt denies SOB or chest pain. Pt denies dizziness, HA, light-headedness, weakness.  Problems this pregnancy include: PUL. Allergies? AMOX, cinnamon, PCN Current medications/supplements? Chlortalidone (blood pressure, swelling), Buspirone Prenatal care provider? Pt reports she has a list of OB providers at home, but has not yet scheduled first prenatal visit   OB History    Gravida  4   Para      Term      Preterm      AB  3   Living        SAB  2   TAB  1   Ectopic      Multiple      Live Births              Past Medical History:  Diagnosis Date  . Hypertension     Past Surgical History:  Procedure Laterality Date  . ENDOMETRIAL ABLATION    . WISDOM  TOOTH EXTRACTION      Family History  Problem Relation Age of Onset  . Diabetes Mother   . Hypertension Mother   . Hypertension Father   . Cancer Maternal Grandmother     Social History   Tobacco Use  . Smoking status: Never Smoker  . Smokeless tobacco: Never Used  Substance Use Topics  . Alcohol use: Never  . Drug use: Never    Allergies:  Allergies  Allergen Reactions  . Amoxicillin Anaphylaxis  . Cinnamon Shortness Of Breath  . Penicillins Rash    Medications Prior to Admission  Medication Sig Dispense Refill Last Dose  . busPIRone (BUSPAR) 5 MG tablet Take 5 mg by mouth 3 (three) times daily.   Past Month at Unknown time  . chlorthalidone (HYGROTON) 25 MG tablet Take 25 mg by mouth daily.   Past Month at Unknown time    Review of Systems  Constitutional: Negative for chills, diaphoresis, fatigue and fever.  Eyes: Negative for visual disturbance.  Respiratory: Negative for shortness of breath.   Cardiovascular: Negative for chest pain.  Gastrointestinal: Positive for nausea and vomiting. Negative for abdominal pain, constipation and diarrhea.  Genitourinary: Positive for pelvic pain. Negative for dysuria, flank pain, frequency, urgency, vaginal bleeding and vaginal discharge.  Neurological: Negative for dizziness, weakness, light-headedness and headaches.   Physical Exam   Blood pressure 117/76,  pulse 95, temperature 98.3 F (36.8 C), resp. rate 16, height 5\' 4"  (1.626 m), weight 81.6 kg, last menstrual period 09/30/2019, SpO2 100 %.  Patient Vitals for the past 24 hrs:  BP Temp Temp src Pulse Resp SpO2 Height Weight  10/30/19 1445 117/76 98.3 F (36.8 C) - 95 16 100 % - -  10/30/19 1248 127/84 99.3 F (37.4 C) Oral (!) 102 18 100 % 5\' 4"  (1.626 m) 81.6 kg   Physical Exam  Constitutional: She is oriented to person, place, and time. She appears well-developed and well-nourished. No distress.  HENT:  Head: Normocephalic and atraumatic.  Respiratory: Effort  normal.  Neurological: She is alert and oriented to person, place, and time.  Skin: She is not diaphoretic.  Psychiatric: She has a normal mood and affect. Her behavior is normal. Judgment and thought content normal.   Results for orders placed or performed during the hospital encounter of 10/30/19 (from the past 24 hour(s))  Lipase, blood     Status: None   Collection Time: 10/30/19  1:18 PM  Result Value Ref Range   Lipase 30 11 - 51 U/L  Comprehensive metabolic panel     Status: Abnormal   Collection Time: 10/30/19  1:18 PM  Result Value Ref Range   Sodium 136 135 - 145 mmol/L   Potassium 4.0 3.5 - 5.1 mmol/L   Chloride 108 98 - 111 mmol/L   CO2 19 (L) 22 - 32 mmol/L   Glucose, Bld 87 70 - 99 mg/dL   BUN 8 6 - 20 mg/dL   Creatinine, Ser 11/01/19 0.44 - 1.00 mg/dL   Calcium 8.7 (L) 8.9 - 10.3 mg/dL   Total Protein 6.4 (L) 6.5 - 8.1 g/dL   Albumin 3.7 3.5 - 5.0 g/dL   AST 21 15 - 41 U/L   ALT 11 0 - 44 U/L   Alkaline Phosphatase 68 38 - 126 U/L   Total Bilirubin 0.8 0.3 - 1.2 mg/dL   GFR calc non Af Amer >60 >60 mL/min   GFR calc Af Amer >60 >60 mL/min   Anion gap 9 5 - 15  CBC     Status: None   Collection Time: 10/30/19  1:18 PM  Result Value Ref Range   WBC 7.8 4.0 - 10.5 K/uL   RBC 4.33 3.87 - 5.11 MIL/uL   Hemoglobin 14.0 12.0 - 15.0 g/dL   HCT 0.16 11/01/19 - 01.0 %   MCV 94.7 80.0 - 100.0 fL   MCH 32.3 26.0 - 34.0 pg   MCHC 34.1 30.0 - 36.0 g/dL   RDW 93.2 35.5 - 73.2 %   Platelets 334 150 - 400 K/uL   nRBC 0.0 0.0 - 0.2 %  Urinalysis, Routine w reflex microscopic     Status: None   Collection Time: 10/30/19  1:20 PM  Result Value Ref Range   Color, Urine YELLOW YELLOW   APPearance CLEAR CLEAR   Specific Gravity, Urine 1.021 1.005 - 1.030   pH 6.0 5.0 - 8.0   Glucose, UA NEGATIVE NEGATIVE mg/dL   Hgb urine dipstick NEGATIVE NEGATIVE   Bilirubin Urine NEGATIVE NEGATIVE   Ketones, ur NEGATIVE NEGATIVE mg/dL   Protein, ur NEGATIVE NEGATIVE mg/dL   Nitrite NEGATIVE  NEGATIVE   Leukocytes,Ua NEGATIVE NEGATIVE  I-Stat beta hCG blood, ED     Status: Abnormal   Collection Time: 10/30/19  1:25 PM  Result Value Ref Range   I-stat hCG, quantitative >2,000.0 (H) <5 mIU/mL  Comment 3           US OB Transvaginal  Result Date: 10/30/2019 CLINICAL DATA:  Abdominal pain. Pregnant patient. Patient is 4 weeks and 2 days pregnant based on her last menstrual period. EXAM: TRANSVAGINAL OB ULTRASOUND TECHNIQUE: Transvaginal ultrasound was performed for complete evaluation of the gestation as well as the maternal uterus, adnexal regions, and pelvic cul-de-sac. COMPARISON:  None. FINDINGS: Intrauterine gestational sac: Single Yolk sac:  Visualized. Embryo:  Not visualized Cardiac Activity: Not applicable MSD: 6.0 mm   5 w   2 d Subchorionic hemorrhage:  None visualized. Maternal uterus/adnexae: No uterine masses. Closed cervix. Right ovary corpus luteum. Ovaries otherwise unremarkable. No adnexal masses. Trace amount of fluid seen next to the right ovary, presumed physiologic. IMPRESSION: 1. Well-formed intrauterine gestational sac containing a yolk sac, but no visible embryo. Findings are consistent with an early intrauterine pregnancy. Recommend follow-up ultrasound in 10-14 days to document normal pregnancy progression. 2. Exam otherwise unremarkable. No evidence of a pregnancy complication. Electronically Signed   By: Amie Portland M.D.   On: 10/30/2019 16:18   US OB LESS THAN 14 WEEKS WITH OB TRANSVAGINAL  Result Date: 10/22/2019 CLINICAL DATA:  Pelvic pain, first trimester pregnancy, quantitative beta HCG = 103; unknown LMP EXAM: OBSTETRIC <14 WK [place] AND TRANSVAGINAL OB US TECHNIQUE: Both transabdominal and transvaginal ultrasound examinations were performed for complete evaluation of the gestation as well as the maternal uterus, adnexal regions, and pelvic cul-de-sac. Transvaginal technique was performed to assess early pregnancy. COMPARISON:  None FINDINGS: Intrauterine  gestational sac: Absent Yolk sac:  N/A Embryo:  N/A Cardiac Activity: N/A Heart Rate: N/A  bpm MSD:   mm    w     d CRL:    mm    w    d                  [place] EDC: Subchorionic hemorrhage:  N/A Maternal uterus/adnexae: Uterus retroverted, normal in morphology. Endometrial complex 12 mm thick, unremarkable. No endometrial fluid or gestational sac. RIGHT ovary normal size and morphology 2.7 x 3.2 x 2.7 cm, containing a corpus luteum. LEFT ovary normal size and morphology, 2.2 x 3.4 x 2.1 cm. Trace free pelvic fluid. No adnexal masses. IMPRESSION: No intrauterine gestation identified. Findings are consistent with pregnancy of unknown location. Differential diagnosis includes early intrauterine pregnancy too early to visualize, spontaneous abortion, and ectopic pregnancy. Serial quantitative beta HCG and or follow-up ultrasound recommended to definitively exclude ectopic pregnancy. Electronically Signed   By: Ulyses Southward M.D.   On: 10/22/2019 13:47    MAU Course  Procedures  MDM -N/V in pregnancy -r/o ectopic -UA: WNL -CBC: WNL -CMP: no abnormalities requiring treatment -[place]: +yolk sac -hCG: pending at time of discharge -ABO: B Positive -WetPrep: WNL on 10/22/2019 -GC/CT: WNL on 10/22/2019 -weight today 81.6kg, 0kg weight loss since 10/22/2019 -pt given Zofran in ED, reports N/V now resolved -PO challenge successful -pt discharged to home in stable condition  Orders Placed This Encounter  Procedures  . US OB Transvaginal    Standing Status:   Standing    Number of Occurrences:   1    Order Specific Question:   Symptom/Reason for Exam    Answer:   Pregnancy of unknown anatomic location [[id number]]  . US OB LESS THAN 14 WEEKS WITH OB TRANSVAGINAL    Please schedule patient for follow-up US, Monday - Thursday between 8:00 am - 10:00 am and 12:00 pm - 3:00  pm. The patient will follow-up with [facility] immediately after [place] for results.    Standing Status:   Future    Standing Expiration Date:    12/29/2020    Order Specific Question:   Reason for Exam (SYMPTOM  OR DIAGNOSIS REQUIRED)    Answer:   viability    Order Specific Question:   Preferred Imaging Location?    Answer:   [facility]  . Lipase, blood    Standing Status:   Standing    Number of Occurrences:   1  . Comprehensive metabolic panel    Standing Status:   Standing    Number of Occurrences:   1  . CBC    Standing Status:   Standing    Number of Occurrences:   1  . Urinalysis, Routine w reflex microscopic    Standing Status:   Standing    Number of Occurrences:   1  . hCG, quantitative, pregnancy    Standing Status:   Standing    Number of Occurrences:   1  . Diet NPO time specified    Standing Status:   Standing    Number of Occurrences:   1  . Saline Lock IV, Maintain IV access    Standing Status:   Standing    Number of Occurrences:   1  . I-Stat beta hCG blood, ED    Standing Status:   Standing    Number of Occurrences:   1  . Discharge patient    Order Specific Question:   Discharge disposition    Answer:   01-Home or Self Care [1]    Order Specific Question:   Discharge patient date    Answer:   10/30/2019   Meds ordered this encounter  Medications  . sodium chloride flush (NS) 0.9 % injection 3 mL  . ondansetron (ZOFRAN-ODT) disintegrating tablet 4 mg  . metoCLOPramide (REGLAN) 10 MG tablet    Sig: Take 1 tablet (10 mg total) by mouth 3 (three) times daily with meals.    Dispense:  90 tablet    Refill:  1    Order Specific Question:   Supervising Provider    Answer:   Reva Bores [[id number]]  . famotidine (PEPCID) 20 MG tablet    Sig: Take 1 tablet (20 mg total) by mouth daily.    Dispense:  60 tablet    Refill:  0    Order Specific Question:   Supervising Provider    Answer:   Reva Bores [[id number]]    Assessment and Plan   1. Nausea and vomiting in pregnancy prior to [redacted] weeks gestation   2. Pregnancy of unknown anatomic location   3. [redacted] weeks gestation of pregnancy   4.  Intrauterine pregnancy    Allergies as of 10/30/2019      Reactions   Amoxicillin Anaphylaxis   Cinnamon Shortness Of Breath   Penicillins Rash      Medication List    TAKE these medications   busPIRone 5 MG tablet Commonly known as: BUSPAR Take 5 mg by mouth 3 (three) times daily.   chlorthalidone 25 MG tablet Commonly known as: HYGROTON Take 25 mg by mouth daily.   famotidine 20 MG tablet Commonly known as: PEPCID Take 1 tablet (20 mg total) by mouth daily.   metoCLOPramide 10 MG tablet Commonly known as: REGLAN Take 1 tablet (10 mg total) by mouth 3 (three) times daily with meals.      -will call  with culture results, if positive -f/u US in 10-14 days to assess for viability -safe meds in pregnancy list given -pt advised to take medications around the clock and not to stop taking if feeling better -RX Reglan -RX Pepcid -discussed nonpharmacologic and pharmacologic treatments of N/V -discussed normal expectations for N/V in pregnancy -pt discharged to home in stable condition  Elmyra Ricks E Nugent 10/30/2019, 5:04 PM

## 2019-11-03 ENCOUNTER — Ambulatory Visit: Payer: Self-pay

## 2019-11-13 ENCOUNTER — Other Ambulatory Visit (HOSPITAL_COMMUNITY): Payer: Self-pay

## 2021-12-05 IMAGING — US US OB < 14 WEEKS - US OB TV
1 series · 13 of 28 positions shown · non-contrast
Comparison: None

CLINICAL DATA: Pelvic pain, first trimester pregnancy, quantitative
beta HCG = 103; unknown LMP

EXAM:
OBSTETRIC <14 WK US AND TRANSVAGINAL OB US
TECHNIQUE: Both transabdominal and transvaginal ultrasound examinations were
performed for complete evaluation of the gestation as well as the
maternal uterus, adnexal regions, and pelvic cul-de-sac.
Transvaginal technique was performed to assess early pregnancy.

[Series 1: us ob < 14 weeks - us ob tv · 79 acquisitions, 13 frames shown]
[im 3/79]
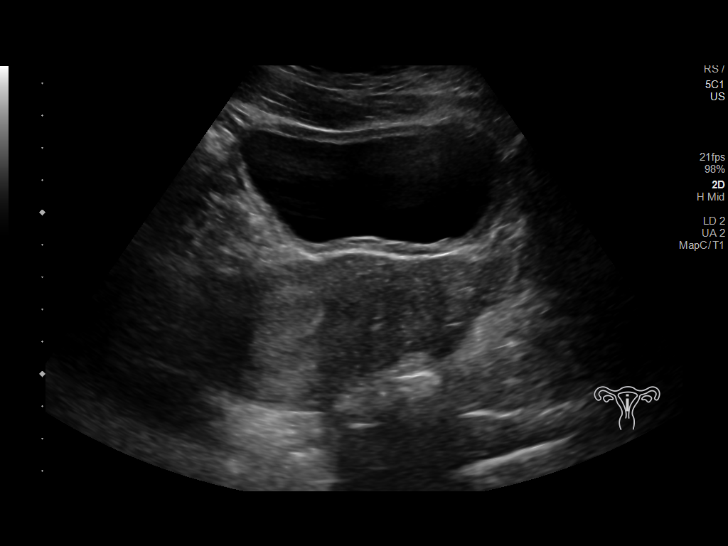
[im 9/79]
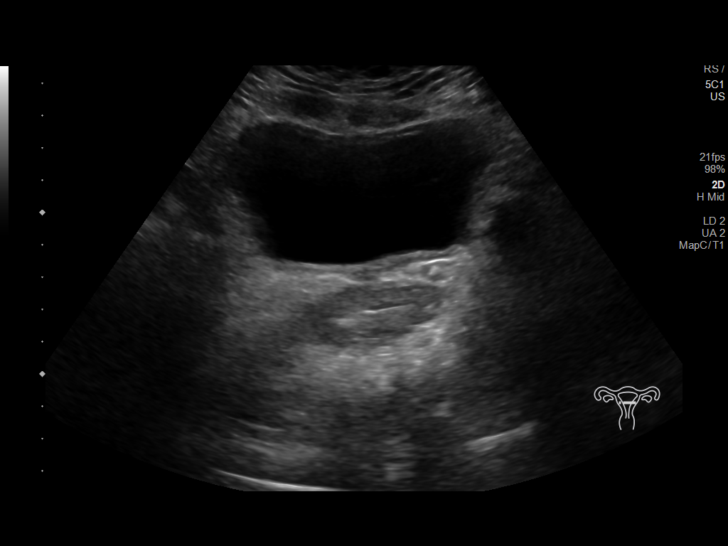
[im 15/79]
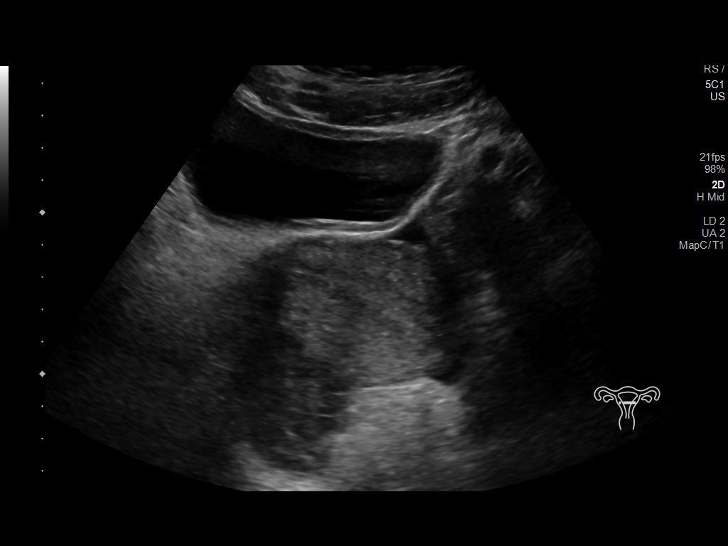
[im 21/79]
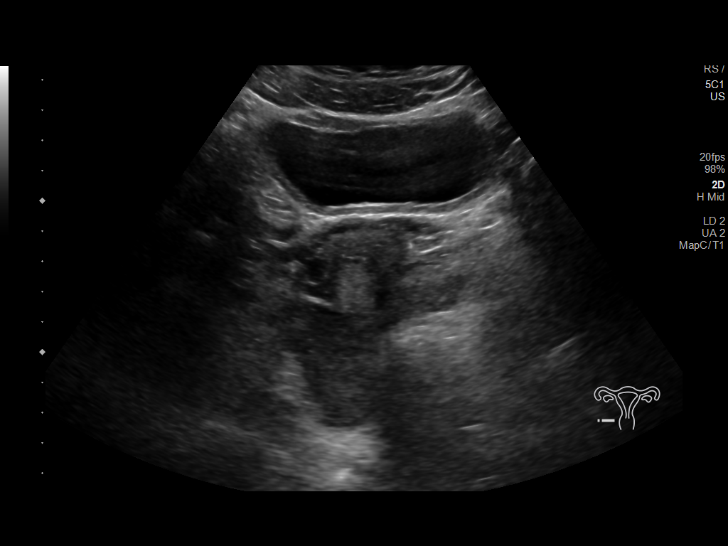
[im 27/79]
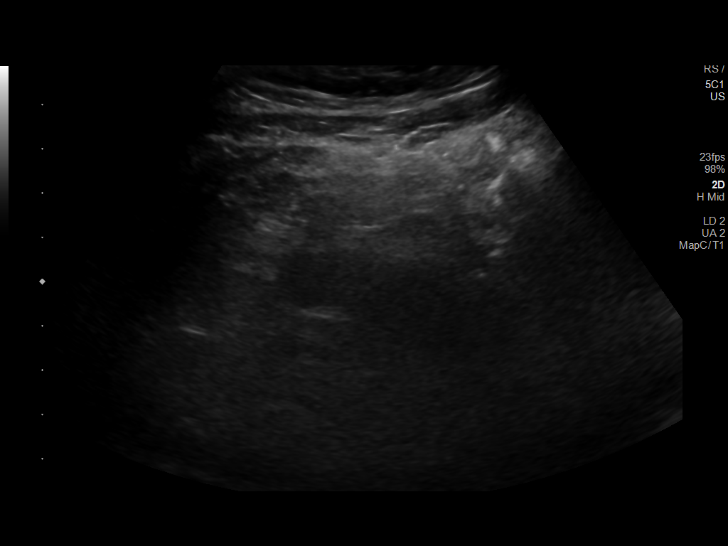
[im 32/79]
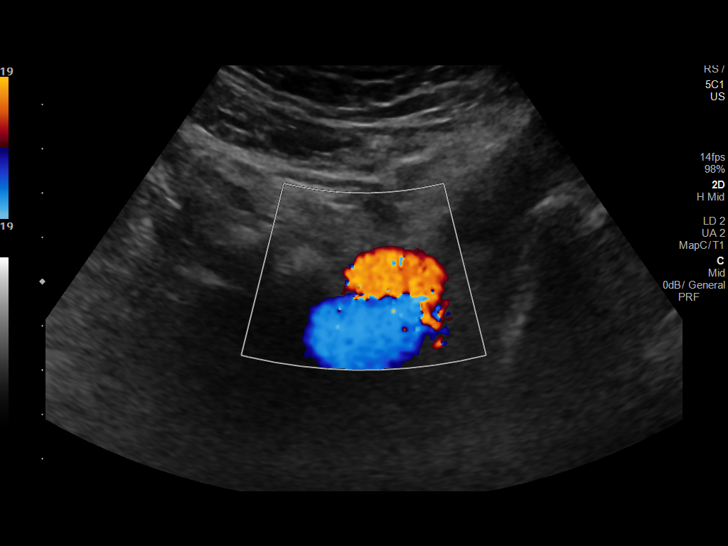
[im 41/79]
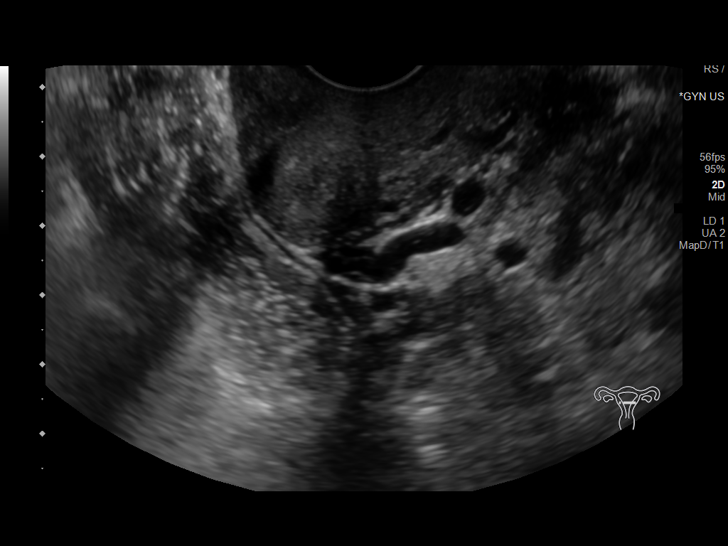
[im 47/79]
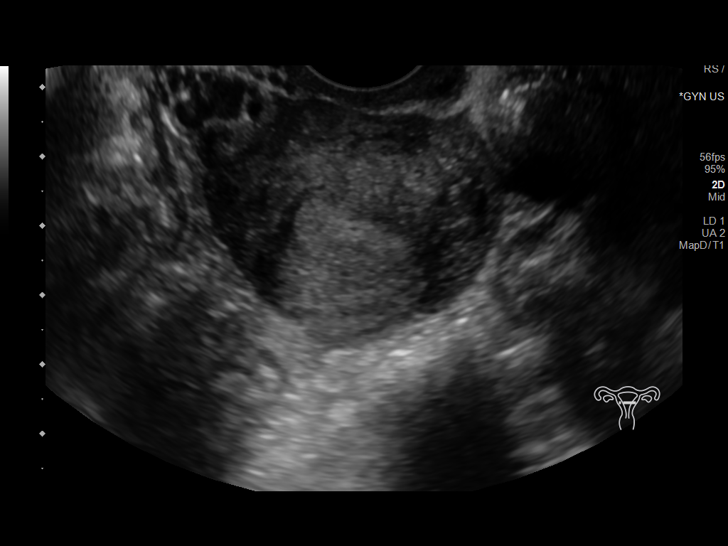
[im 53/79]
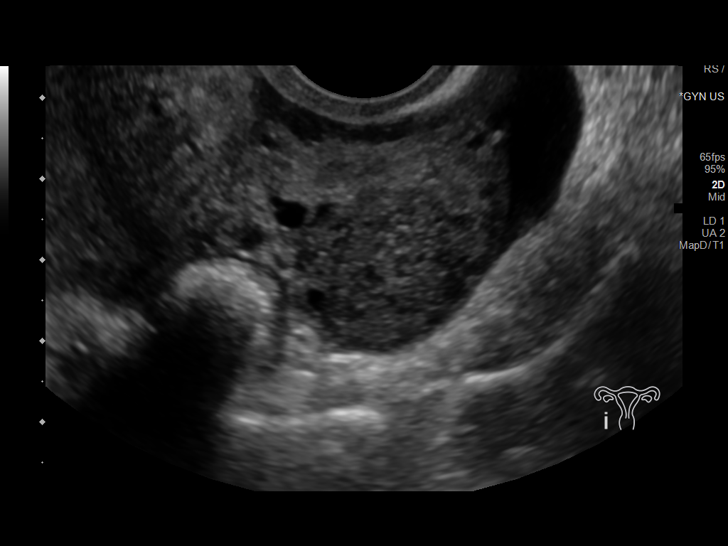
[im 58/79]
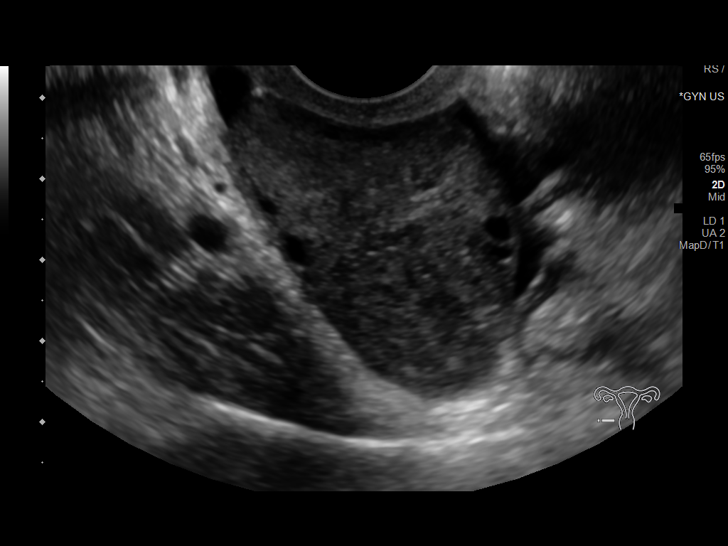
[im 64/79]
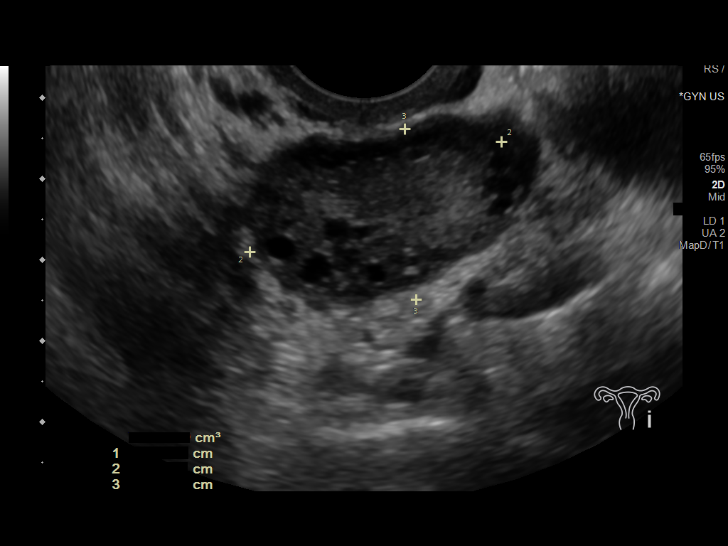
[im 70/79]
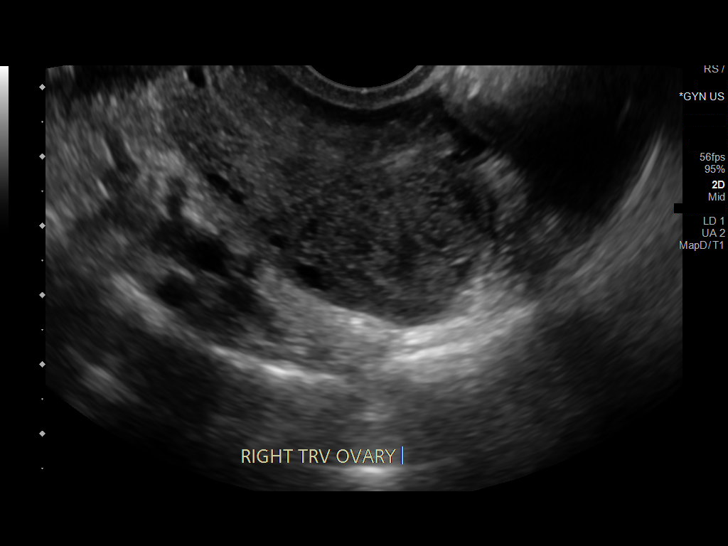
[im 76/79]
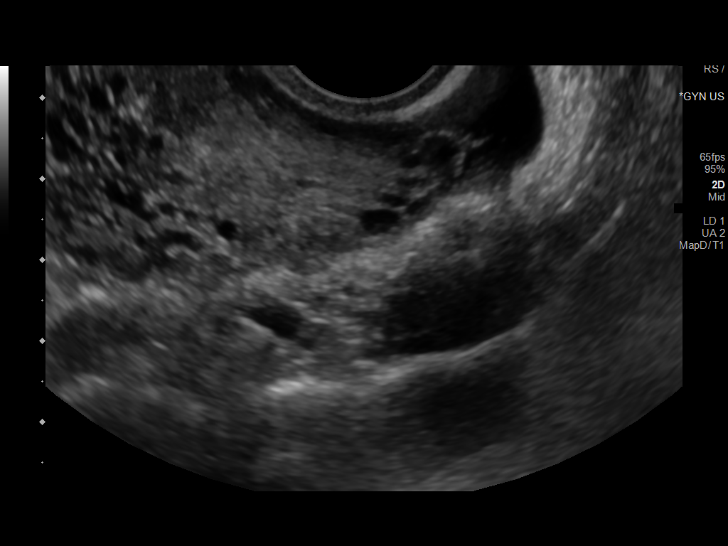

[13 of 28 positions shown; findings below may reference images not displayed]

FINDINGS: Intrauterine gestational sac: Absent

Yolk sac:  N/A

Embryo:  N/A

Cardiac Activity: N/A

Heart Rate: N/A  bpm

MSD:   mm    w     d

CRL:    mm    w    d                  US EDC:

Subchorionic hemorrhage:  N/A

Maternal uterus/adnexae:

Uterus retroverted, normal in morphology.

Endometrial complex 12 mm thick, unremarkable.

No endometrial fluid or gestational sac.

RIGHT ovary normal size and morphology 2.7 x 3.2 x 2.7 cm,
containing a corpus luteum.

LEFT ovary normal size and morphology, 2.2 x 3.4 x 2.1 cm.

Trace free pelvic fluid.

No adnexal masses.
IMPRESSION: No intrauterine gestation identified.

Findings are consistent with pregnancy of unknown location.

Differential diagnosis includes early intrauterine pregnancy too
early to visualize, spontaneous abortion, and ectopic pregnancy.

Serial quantitative beta HCG and or follow-up ultrasound recommended
to definitively exclude ectopic pregnancy.

## 2021-12-13 IMAGING — US US OB TRANSVAGINAL
1 series · 15 of 25 positions shown · non-contrast
Comparison: None.

CLINICAL DATA: Abdominal pain. Pregnant patient. Patient is 4 weeks
and 2 days pregnant based on her last menstrual period.

EXAM:
TRANSVAGINAL OB ULTRASOUND
TECHNIQUE: Transvaginal ultrasound was performed for complete evaluation of the
gestation as well as the maternal uterus, adnexal regions, and
pelvic cul-de-sac.

[Series 1: us ob transvaginal · 25 acquisitions, 15 frames shown]
[im 1/25]
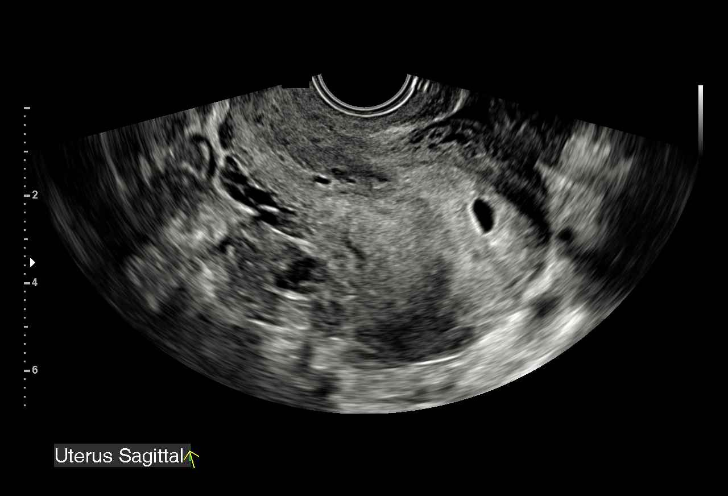
[im 3/25]
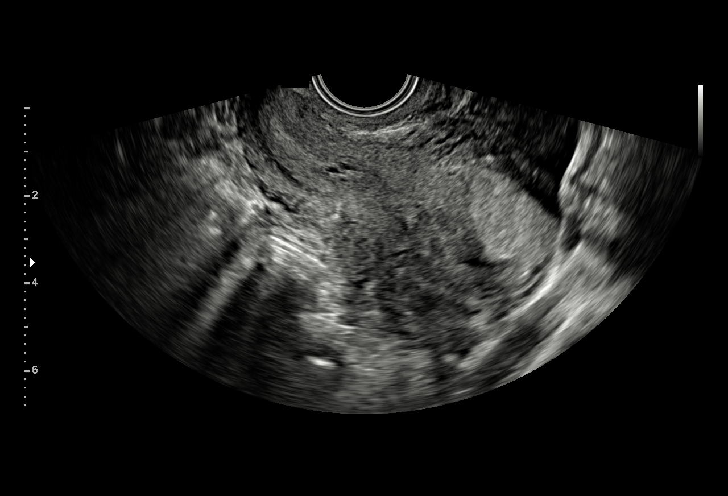
[im 5/25]
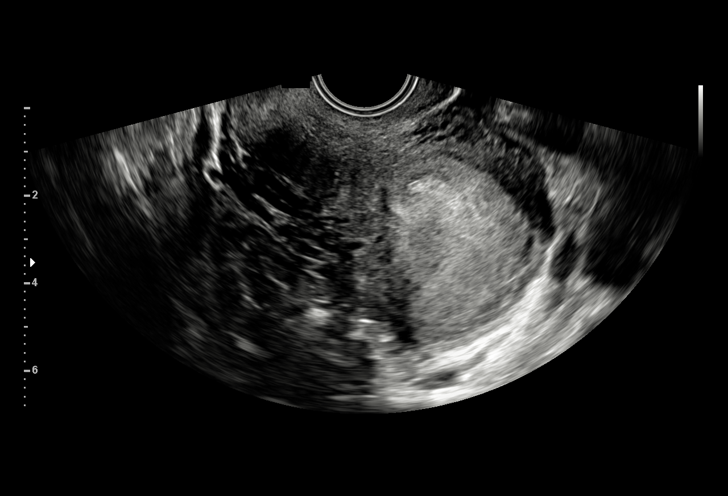
[im 6/25]
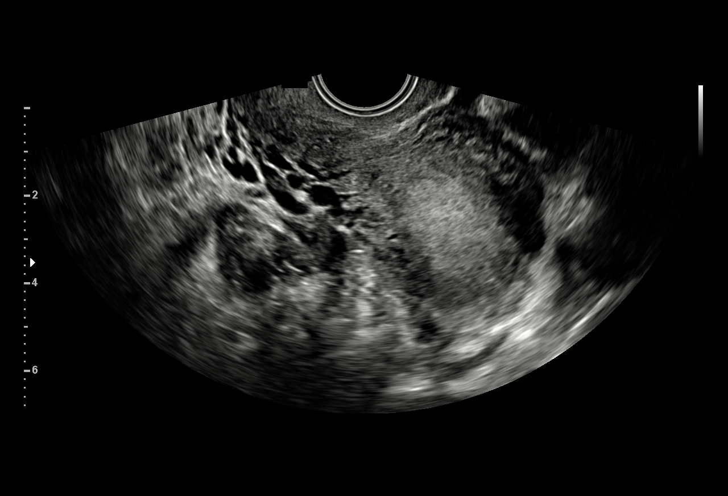
[im 8/25]
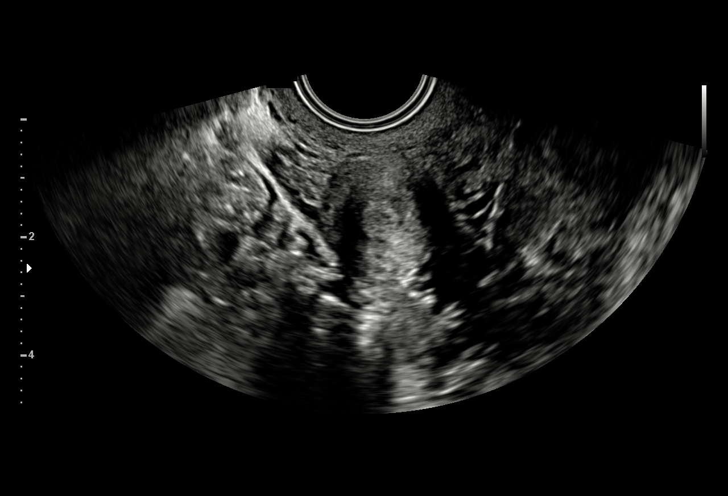
[im 10/25]
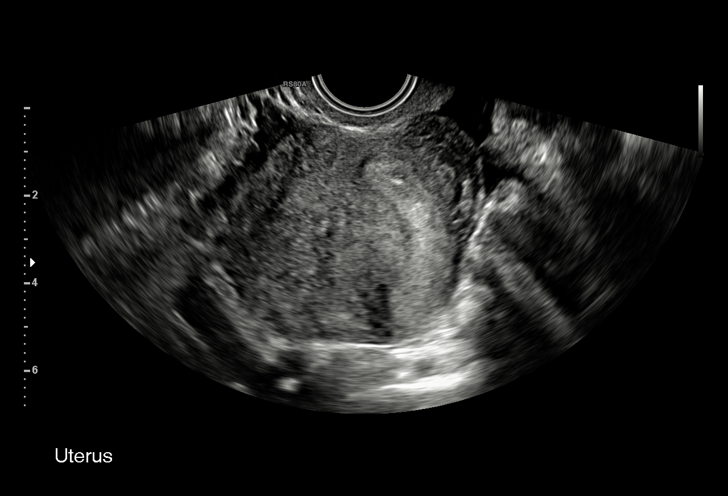
[im 11/25]
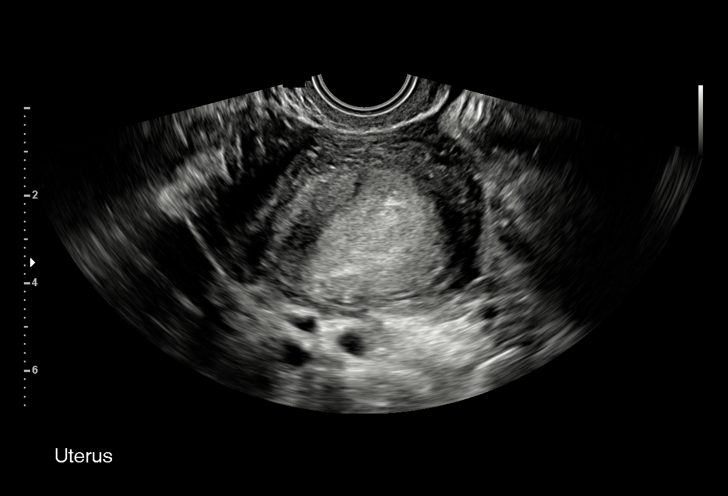
[im 13/25]
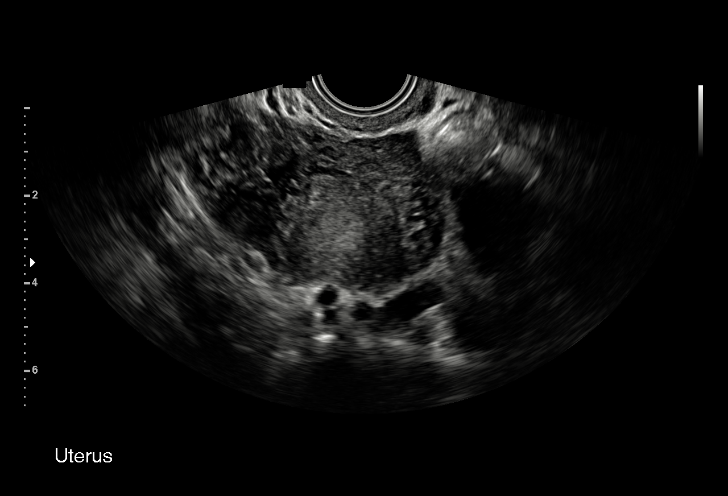
[im 15/25]
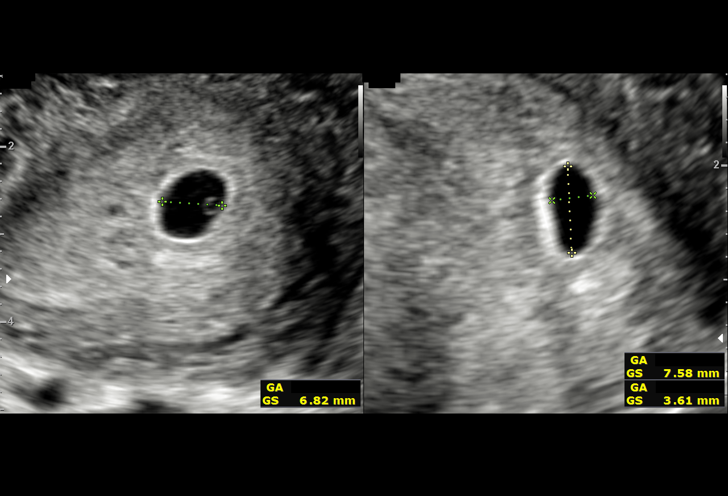
[im 16/25]
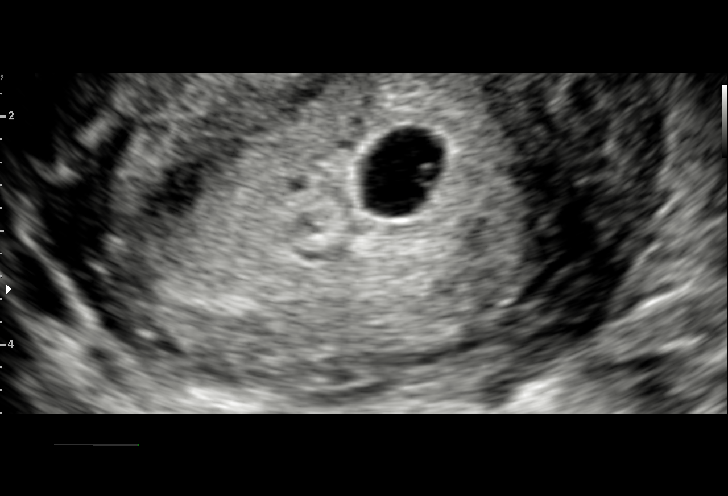
[im 18/25]
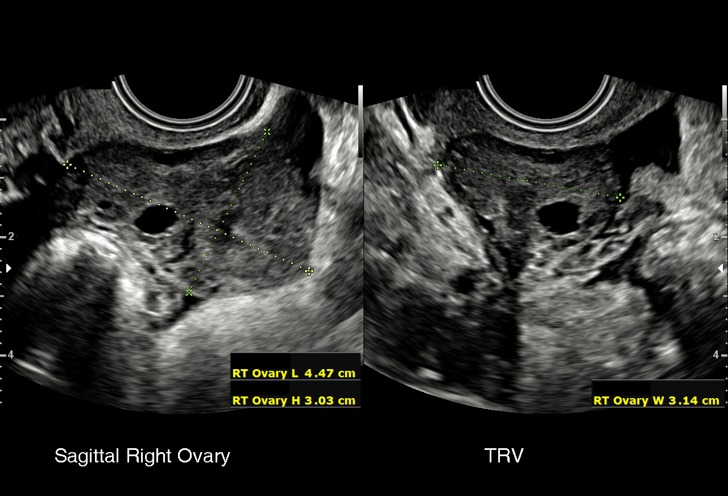
[im 20/25]
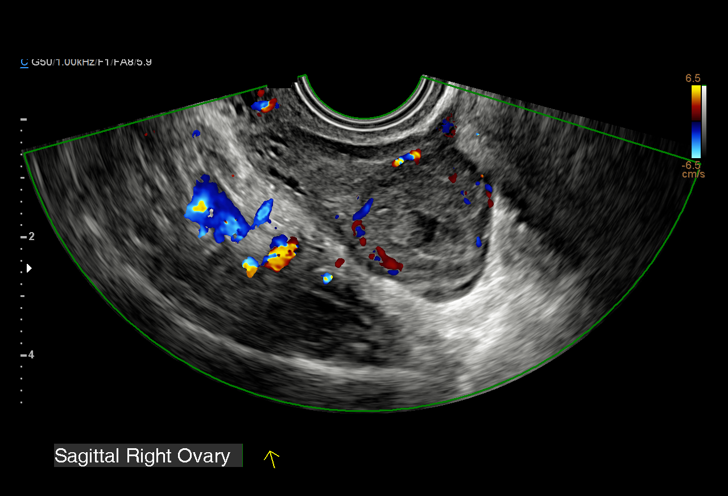
[im 21/25]
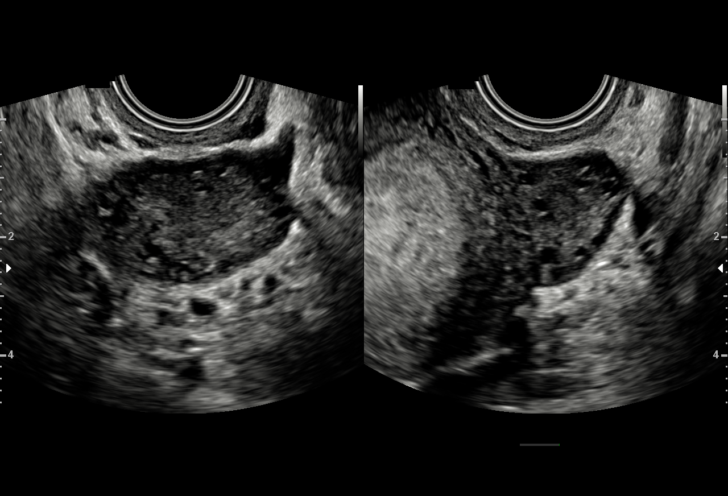
[im 23/25]
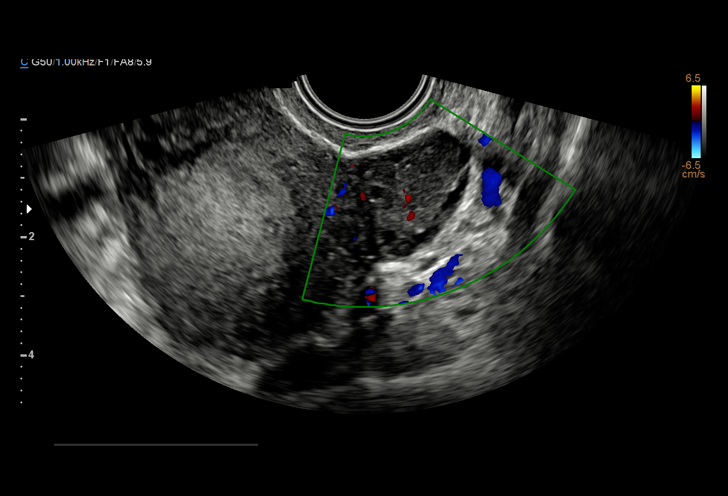
[im 25/25]
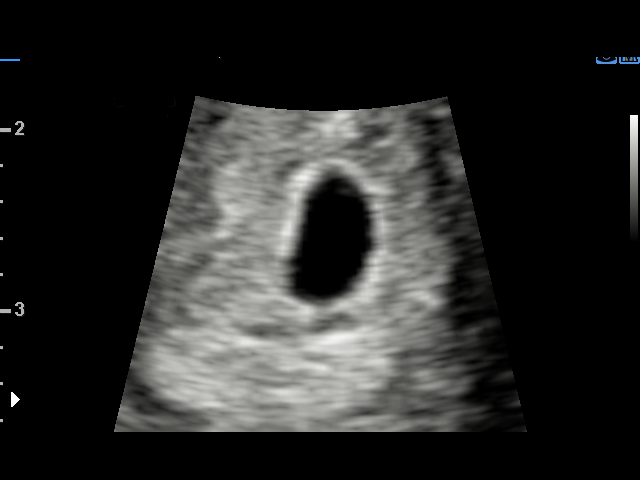

[15 of 25 positions shown; findings below may reference images not displayed]

FINDINGS: Intrauterine gestational sac: Single

Yolk sac:  Visualized.

Embryo:  Not visualized

Cardiac Activity: Not applicable

MSD: 6.0 mm   5 w   2 d

Subchorionic hemorrhage:  None visualized.

Maternal uterus/adnexae: No uterine masses. Closed cervix. Right
ovary corpus luteum. Ovaries otherwise unremarkable. No adnexal
masses. Trace amount of fluid seen next to the right ovary, presumed
physiologic.
IMPRESSION: 1. Well-formed intrauterine gestational sac containing a yolk sac,
but no visible embryo. Findings are consistent with an early
intrauterine pregnancy. Recommend follow-up ultrasound in 10-14 days
to document normal pregnancy progression.
2. Exam otherwise unremarkable. No evidence of a pregnancy
complication.
